# Patient Record
Sex: Male | Born: 2000 | Race: Black or African American | Hispanic: No | Marital: Single
Health system: Southern US, Community
[De-identification: ages and names within clinical notes are randomized; demographics above are authoritative.]

---
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
## 2020-10-08 ENCOUNTER — Other Ambulatory Visit: Payer: Self-pay | Admitting: Interventional Radiology

## 2020-10-08 DIAGNOSIS — I861 Scrotal varices: Secondary | ICD-10-CM

## 2020-10-20 ENCOUNTER — Encounter: Payer: Self-pay | Admitting: *Deleted

## 2020-10-20 ENCOUNTER — Ambulatory Visit
Admission: RE | Admit: 2020-10-20 | Discharge: 2020-10-20 | Disposition: A | Discharge status: Home / Self Care | Payer: BC Managed Care – PPO | Source: Ambulatory Visit | Attending: Interventional Radiology | Admitting: Interventional Radiology

## 2020-10-20 DIAGNOSIS — I861 Scrotal varices: Secondary | ICD-10-CM

## 2020-10-20 HISTORY — PX: IR RADIOLOGIST EVAL & MGMT: IMG5224

## 2020-10-20 NOTE — Progress Notes (Signed)
Chief Complaint: Patient was seen in consultation today for follow up of left varicocele.  History of Present Illness: Nathan Fuller is a 20 y.o. male who was first seen in January, 2020 for a prominent left varicocele with associated symptoms of discomfort.  Prior ultrasounds had demonstrated slight left testicular atrophy relative to the right.  Due to serial ultrasound evidence of decreasing discrepancy in testicular size and also decreasing symptoms, decision was made to continue to follow the varicocele and testicular volume.  A follow-up ultrasound was performed on 10/05/2018 that demonstrated equal sized testicles with no significant demonstrable left testicular atrophy.  Nathan Fuller has now completed 2 years of college at [employer] and is a Psychology major with a minor in [place] Studies.  He had surgery in December 2021 on both hips for bilateral labral tears and after repair he is doing well and resuming athletic activity.  He has some occasional mild discomfort related to the left varicocele and sometimes some tenderness when performing self examination.  He does not have any significant discomfort with physical activity or exercise. He had a physical and wellness exam in May and was advised to have his varicocele rechecked.  Allergies: Patient has no known allergies.  Medications: Prior to Admission medications   Medication Sig Start Date End Date Taking? Authorizing Provider  loratadine-pseudoephedrine (CLARITIN-D 24-HOUR) 10-240 MG 24 hr tablet Take 1 tablet by mouth daily as needed for allergies.    [provider]      Social History   Socioeconomic History   Marital status: Single    Spouse name: Not on file   Number of children: Not on file   Years of education: Not on file   Highest education level: Not on file  Occupational History   Not on file  Tobacco Use   Smoking status: Not on file   Smokeless tobacco: Not on file  Substance and Sexual  Activity   Alcohol use: Not on file   Drug use: Not on file   Sexual activity: Not on file  Other Topics Concern   Not on file  Social History Narrative   Not on file   Social Determinants of Health   Financial Resource Strain: Not on file  Food Insecurity: Not on file  Transportation Needs: Not on file  Physical Activity: Not on file  Stress: Not on file  Social Connections: Not on file    Review of Systems: A 12 point ROS discussed and pertinent positives are indicated in the HPI above.  All other systems are negative.  Review of Systems  Constitutional: Negative.   Respiratory: Negative.    Cardiovascular: Negative.   Genitourinary:        See above. Occasional mild left scrotal discomfort at level of varicocele.  Musculoskeletal: Negative.   Neurological: Negative.    Vital Signs: There were no vitals taken for this visit.  Physical Exam Genitourinary:    Penis: Normal.      Testes: Normal.     Comments: Large, palpable 4+ left varicocele with distention with Valsalva maneuver. Testicles are equal in volume with no palpable masses.    Imaging: US SCROTUM  Result Date: 10/20/2020 CLINICAL DATA:  Known prominent left varicocele. Evaluation for testicular size and volume. EXAM: ULTRASOUND OF SCROTUM TECHNIQUE: Complete ultrasound examination of the testicles, epididymis, and other scrotal structures was performed. COMPARISON:  10/05/2018 FINDINGS: Right testicle Measurements: 4.2 x 1.8 x 2.6 cm. No mass or microlithiasis visualized. Estimated volume of  10.2 mL. Left testicle Measurements: 4.2 x 2.1 x 2.5 cm. No mass or microlithiasis visualized. Estimated volume of 11.5 mL. Right epididymis:  Normal in size and appearance. Left epididymis:  Normal in size and appearance. Hydrocele:  None visualized. Varicocele:  Prominent left varicocele again identified. IMPRESSION: Increase in bilateral testicular volume since the prior ultrasound with slightly larger left testicle  compared to the right and no evidence of left testicular atrophy secondary to a known prominent left varicocele. No evidence of testicular mass. Electronically Signed   By: Irish Lack M.D.   On: 10/20/2020 11:36     Assessment and Plan:  A follow-up scrotal ultrasound was performed today.  Since the prior ultrasound 2 years ago, there is evidence of further testicular growth and increase in volumes with estimated right testicular volume of 10.2 mL and left testicular volume of 11.5 mL.  The left testicle is slightly larger and there is no evidence of left testicular atrophy secondary to the known varicocele.  Prominent left varicocele is evident by ultrasound as well as physical examination.  Due to his minimal symptoms of occasional discomfort and scrotal ultrasound demonstrating no evidence of left testicular atrophy, I did not recommend that we pursue any treatment of his varicocele at this time.  I told Nathan Fuller to contact me should he develop more significant discomfort on a regular basis related to the varicocele, especially with physical activity or prolonged standing.  I also told him to contact us should he develop noticeable size discrepancy of his testicles with decrease in size of the left relative to the right on self-examination.   Electronically Signed: Reola Calkins 10/20/2020, 11:46 AM    I spent a total of  10 Minutes in face to face in clinical consultation, greater than 50% of which was counseling/coordinating care for left varicocele.

## 2022-11-14 IMAGING — US US SCROTUM
1 series · 14 of 25 positions shown · non-contrast
Comparison: 10/05/2018

CLINICAL DATA: Known prominent left varicocele. Evaluation for
testicular size and volume.

EXAM:
ULTRASOUND OF SCROTUM
TECHNIQUE: Complete ultrasound examination of the testicles, epididymis, and
other scrotal structures was performed.

[Series 1: us scrotum · 0.05mm/px · 14 of 32 slices shown]
[im 1/32]
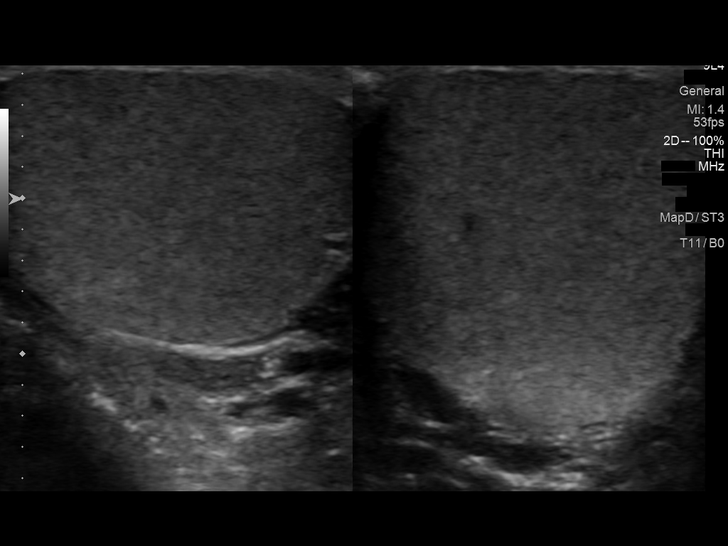
[im 3/32]
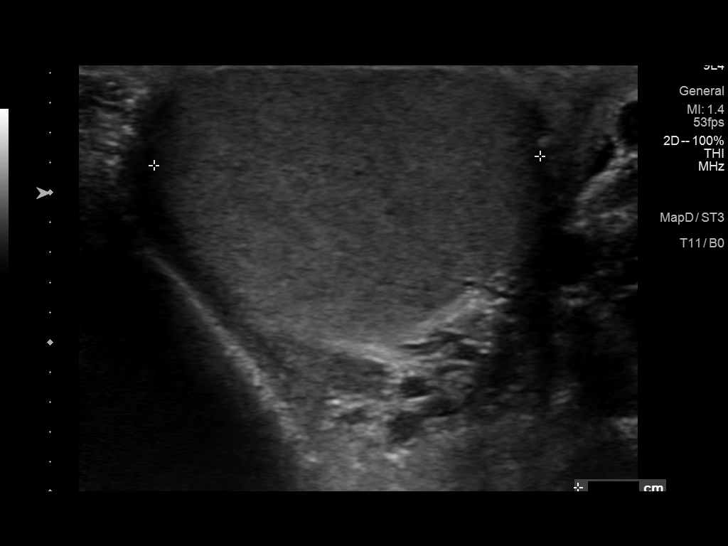
[im 6/32]
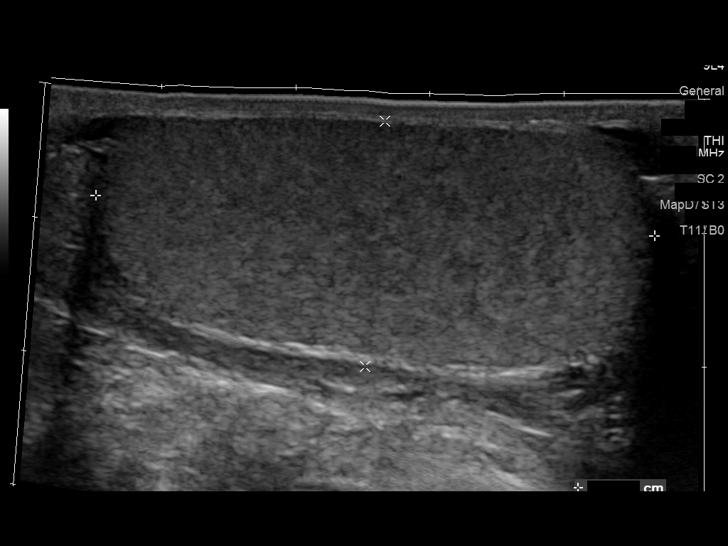
[im 8/32]
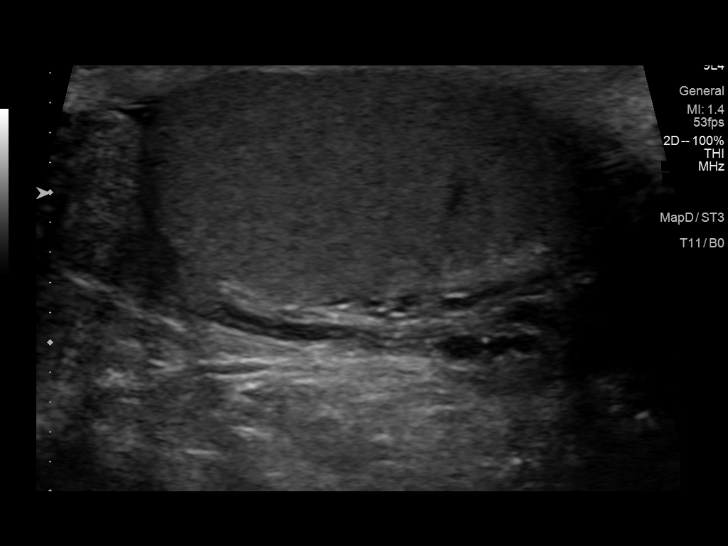
[im 11/32]
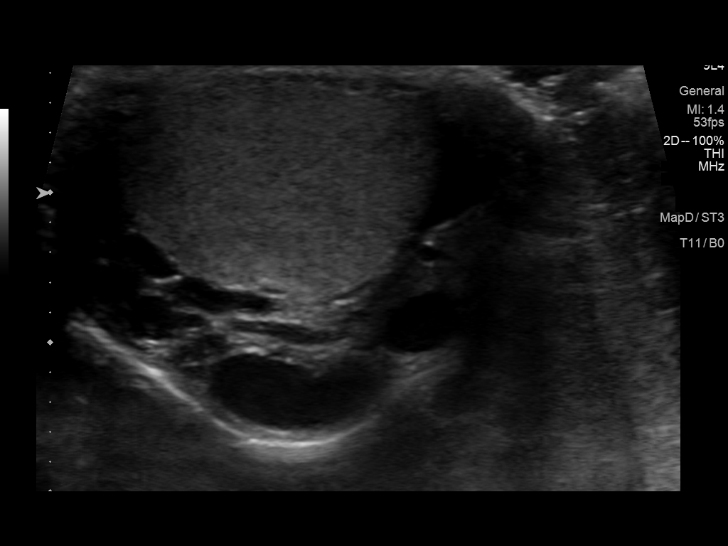
[im 12/32]
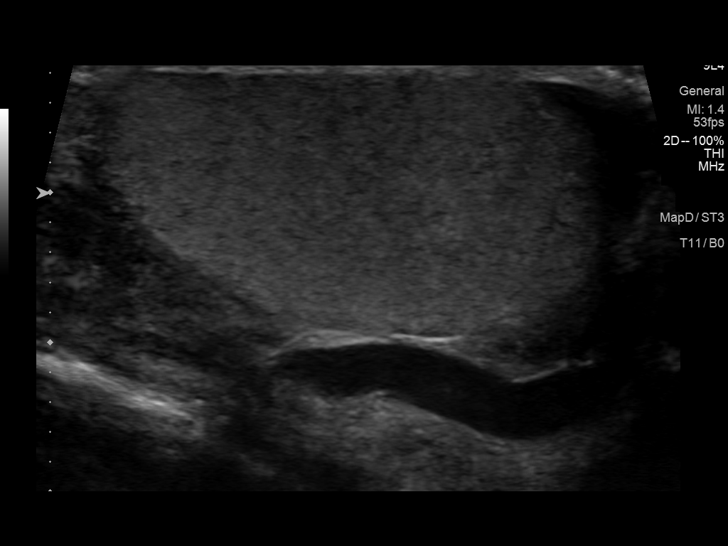
[im 15/32]
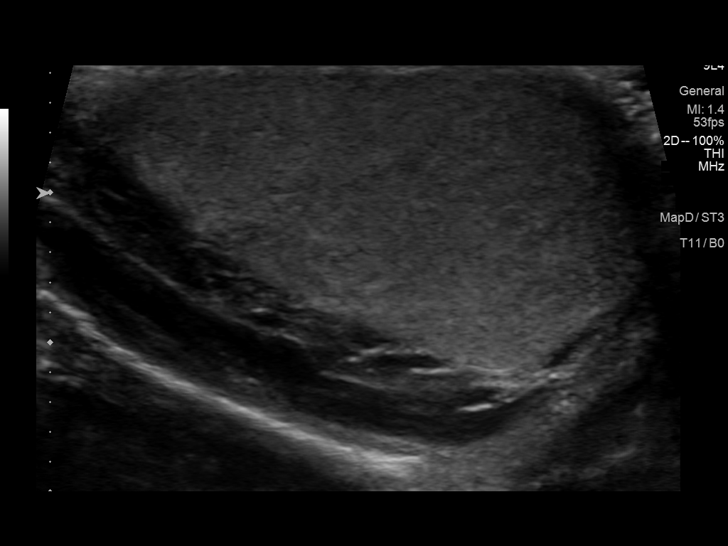
[im 17/32]
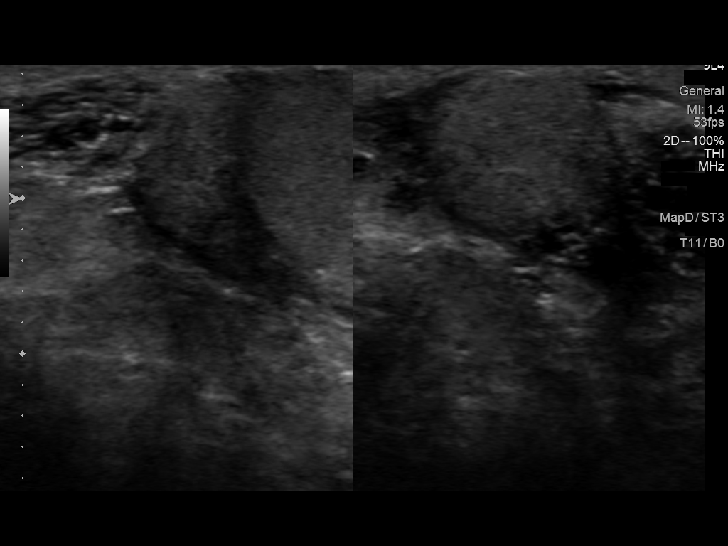
[im 20/32]
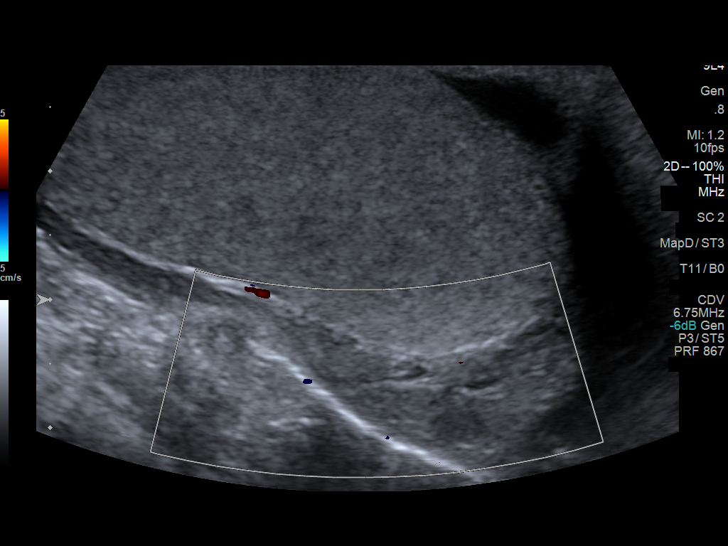
[im 21/32]
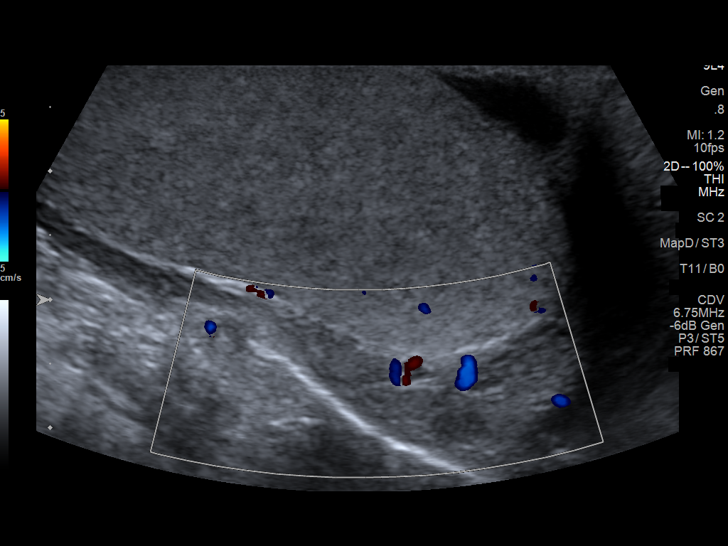
[im 24/32]
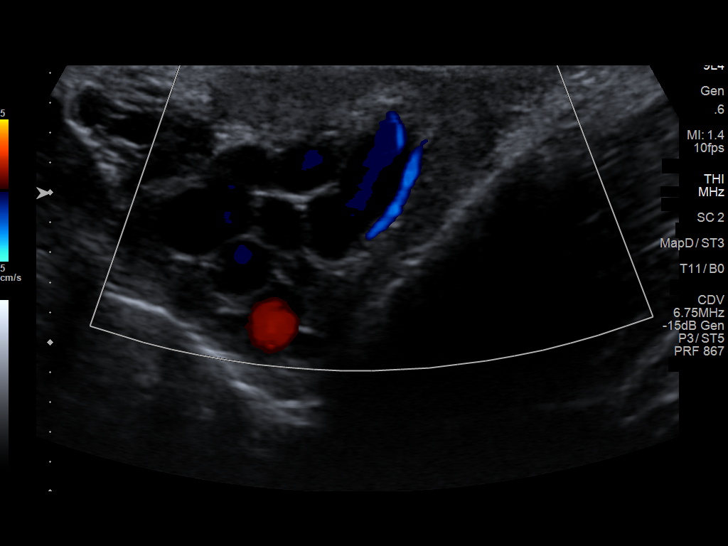
[im 26/32]
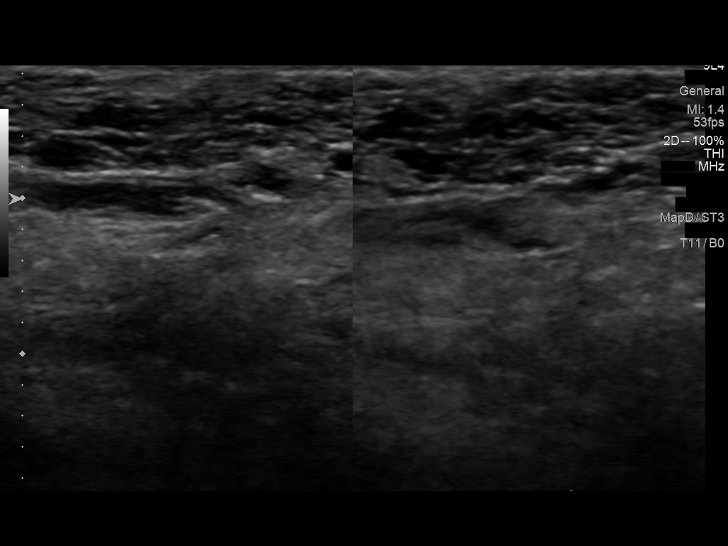
[im 29/32]
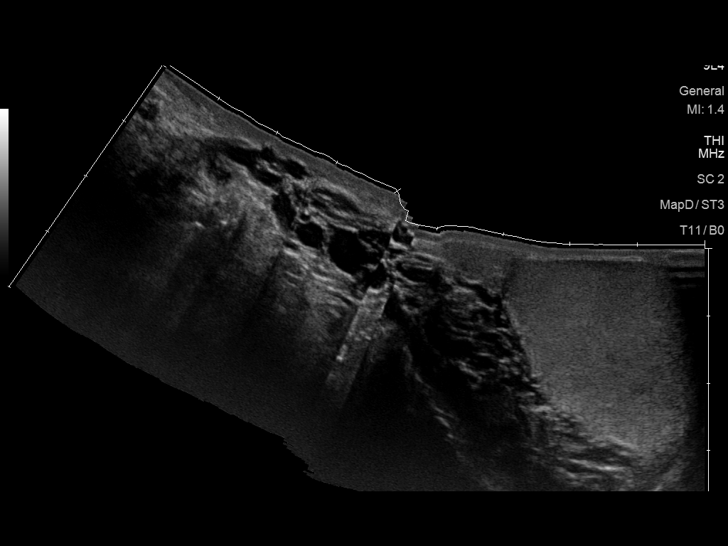
[im 32/32]
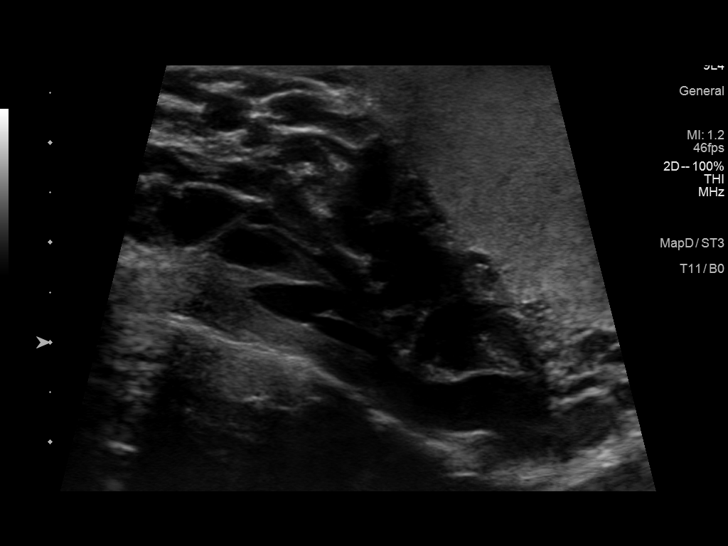

[14 of 25 positions shown; findings below may reference images not displayed]

FINDINGS: Right testicle

Measurements: 4.2 x 1.8 x 2.6 cm. No mass or microlithiasis
visualized. Estimated volume of 10.2 mL.

Left testicle

Measurements: 4.2 x 2.1 x 2.5 cm. No mass or microlithiasis
visualized. Estimated volume of 11.5 mL.

Right epididymis:  Normal in size and appearance.

Left epididymis:  Normal in size and appearance.

Hydrocele:  None visualized.

Varicocele:  Prominent left varicocele again identified.
IMPRESSION: Increase in bilateral testicular volume since the prior ultrasound
with slightly larger left testicle compared to the right and no
evidence of left testicular atrophy secondary to a known prominent
left varicocele. No evidence of testicular mass.

## 2023-05-15 ENCOUNTER — Other Ambulatory Visit: Payer: Self-pay | Admitting: Interventional Radiology

## 2023-05-15 ENCOUNTER — Other Ambulatory Visit: Payer: Self-pay | Admitting: Pediatrics

## 2023-05-15 DIAGNOSIS — I861 Scrotal varices: Secondary | ICD-10-CM

## 2023-05-29 ENCOUNTER — Ambulatory Visit
Admission: RE | Admit: 2023-05-29 | Discharge: 2023-05-29 | Disposition: A | Discharge status: Home / Self Care | Payer: BC Managed Care – PPO | Source: Ambulatory Visit | Attending: Pediatrics | Admitting: Pediatrics

## 2023-05-29 ENCOUNTER — Ambulatory Visit
Admission: RE | Admit: 2023-05-29 | Discharge: 2023-05-29 | Disposition: A | Discharge status: Home / Self Care | Payer: BC Managed Care – PPO | Source: Ambulatory Visit | Attending: Interventional Radiology | Admitting: Interventional Radiology

## 2023-05-29 DIAGNOSIS — I861 Scrotal varices: Secondary | ICD-10-CM

## 2023-05-29 HISTORY — PX: IR RADIOLOGIST EVAL & MGMT: IMG5224

## 2023-05-29 NOTE — Progress Notes (Signed)
 Chief Complaint: Patient was seen in consultation today for follow up of left varicocele.   History of Present Illness: Nathan Fuller is a 23 y.o. male who was first seen in January, 2020 for a prominent left varicocele with associated symptoms of discomfort.  Prior ultrasounds had demonstrated slight left testicular atrophy relative to the right.  Due to serial ultrasound evidence of decreasing discrepancy in testicular size and also decreasing symptoms, decision was made to continue to follow the varicocele and testicular volume. Prior ultrasound in 2020 and 2022 did not show evidence of left testicular atrophy.   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 He asked to be seen today due to increasing symptoms related to his left varicocele over the last year. He has noticed increasing "dull ache" and discomfort in his left scrotum especially with any strenuous activity or exercise. He also notices discomfort at other times during the day especially with prolonged standing and has symptoms of discomfort roughly every other day. His discomfort has gotten to the point where it is quite noticeable and affecting his everyday activities. He denies other abdominal or pelvic pain. He has had no other genital symptoms.   Allergies: Patient has no known allergies.  Medications: Prior to Admission medications   Medication Sig Start Date End Date Taking? Authorizing Provider  loratadine-pseudoephedrine (CLARITIN-D 24-HOUR) 10-240 MG 24 hr tablet Take 1 tablet by mouth daily as needed for allergies.    [provider]     No family history on file.  Social History   Socioeconomic History   Marital status: Single    Spouse name: Not on file   Number of children: Not on file   Years of education: Not on file   Highest education level: Not on file  Occupational History   Not on file  Tobacco Use   Smoking status: Not on file   Smokeless  tobacco: Not on file  Substance and Sexual Activity   Alcohol use: Not on file   Drug use: Not on file   Sexual activity: Not on file  Other Topics Concern   Not on file  Social History Narrative   Not on file   Social Drivers of Health   Financial Resource Strain: Low Risk  (07/07/2021)   Received from [facility]   Overall Financial Resource Strain (CARDIA)    Difficulty of Paying Living Expenses: Not hard at all  Food Insecurity: No Food Insecurity (07/07/2021)   Received from [facility]   Hunger Vital Sign    Worried About Running Out of Food in the Last Year: Never true    Ran Out of Food in the Last Year: Never true  Transportation Needs: No Transportation Needs (07/07/2021)   Received from [facility] - Transportation    In the past 12 months, has lack of transportation kept you from medical appointments or from getting medications?: No    Lack of Transportation (Non-Medical): No  Physical Activity: Sufficiently Active (04/01/2021)   Received from [facility]   Exercise Vital Sign    Days of Exercise per Week: 5 days    Minutes of Exercise per Session: 150+ min  Stress: No Stress Concern Present (04/01/2021)   Received from [facility] of Occupational Health - Occupational Stress Questionnaire    Feeling of Stress : Only a little  Social Connections:  Moderately Integrated (04/01/2021)   Received from [facility]   Social Connection and Isolation Panel [NHANES]    Frequency of Communication with Friends and Family: More than three times a week    Frequency of Social Gatherings with Friends and Family: More than three times a week    Attends Religious Services: More than 4 times per year    Active Member of [employer] or Organizations: Yes    Attends Engineer, structural: More than 4 times per year    Marital Status: Never married      Review of Systems: A 12 point ROS discussed and pertinent positives are indicated in the HPI above.  All other systems are negative.  Review of Systems  Constitutional: Negative.   Respiratory: Negative.    Cardiovascular: Negative.   Gastrointestinal: Negative.   Genitourinary:  Positive for testicular pain. Negative for dysuria, flank pain, frequency, genital sores, hematuria, penile discharge, penile pain and penile swelling.  Musculoskeletal: Negative.   Skin: Negative.   Neurological: Negative.     Vital Signs: There were no vitals taken for this visit.    Physical Exam Genitourinary:    Penis: Normal.      Comments: Very prominent 4+ left varicocele with distention with Valsalva maneuver. No palpable testicular masses or evidence of testicular atrophy.    Imaging: US  SCROTUM W/DOPPLER Result Date: 05/29/2023 CLINICAL DATA:  History of 4+ left varicocele. Evaluation for testicular size and volume. EXAM: SCROTAL ULTRASOUND DOPPLER ULTRASOUND OF THE TESTICLES TECHNIQUE: Complete ultrasound examination of the testicles, epididymis, and other scrotal structures was performed. Color and spectral Doppler ultrasound were also utilized to evaluate blood flow to the testicles. COMPARISON:  10/20/2020 FINDINGS: Right testicle Measurements: 4.2 x 1.7 x 2.8 cm. Estimated volume 10.4 mL. No mass or microlithiasis visualized. Left testicle Measurements: 3.8 x 1.8 x 2.9 cm. Estimated volume of 10.3 mL. No mass or microlithiasis visualized. Right epididymis:  Normal in size and appearance. Left epididymis:  Normal in size and appearance. Hydrocele:  None visualized. Varicocele: Prominent left varicocele again identified which increases in size with Valsalva maneuver. Pulsed Doppler interrogation of both testes demonstrates normal low resistance arterial and venous waveforms bilaterally. IMPRESSION: 1. Normal testicular size with equal volume of both testes. 2. Prominent left varicocele again  identified. Electronically Signed   By: Erica Hau M.D.   On: 05/29/2023 09:01     Assessment and Plan:  Nathan Fuller has had progressive symptoms of pain related to his prominent left varicocele over the past year to the point of nearly daily pain, particularly with exertion. There is no evidence of significant left testicular atrophy by ultrasound, although the left testicular volume is slightly smaller compared to 2022 but still equal to the right.   Based on his progressive symptoms, we again discussed the possibility of elective venography and embolization of the left testicular vein to decompress the varicocele for symptom control and to decrease the possibility of future infertility. Technical details, outcomes and recovery related to the procedure were discussed. After discussion, Nathan Fuller is interested in scheduling the procedure. We will schedule the procedure at [facility] at a convenient time for his work schedule.    Electronically Signed: Aileen Alexanders 05/29/2023, 8:08 AM    I spent a total of 15 Minutes in face to face in clinical consultation, greater than 50% of which was counseling/coordinating care for symptomatic left varicocele.

## 2023-07-06 ENCOUNTER — Telehealth (HOSPITAL_COMMUNITY): Payer: Self-pay | Admitting: Radiology

## 2023-07-06 NOTE — Telephone Encounter (Signed)
 Called pt to schedule his left testicular venogram and embolization with Dr. Fredia Sorrow. Left VM for him to call back to set this up. JM

## 2023-07-13 ENCOUNTER — Other Ambulatory Visit (HOSPITAL_COMMUNITY): Payer: Self-pay | Admitting: Interventional Radiology

## 2023-07-13 DIAGNOSIS — N50812 Left testicular pain: Secondary | ICD-10-CM

## 2023-07-13 DIAGNOSIS — I861 Scrotal varices: Secondary | ICD-10-CM

## 2023-08-02 ENCOUNTER — Other Ambulatory Visit: Payer: Self-pay | Admitting: Student

## 2023-08-02 ENCOUNTER — Other Ambulatory Visit: Payer: Self-pay | Admitting: Radiology

## 2023-08-02 DIAGNOSIS — I861 Scrotal varices: Secondary | ICD-10-CM

## 2023-08-02 NOTE — H&P (Signed)
 Chief Complaint: Patient was seen in consultation today for testicular varicocele  Referring Physician(s): Dr. Ihor Gully  Supervising Physician: Irish Lack  Patient Status: Nathan Fuller - Out-pt  History of Present Illness: Nathan Fuller is a 23 y.o. 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  Patient initially presented at the referral of Dr. Vernie Ammons in 2020 at which time he met in consultation with Dr. Fredia Sorrow as he was transitioning from pediatric to adult Urologic care. After discussion and imaging, decision was made to follow with surveillance as the left testicle had remained stable in size with only slight atrophy compared to the right.  More recently, he has become more symptomatic with increased discomfort, exercise intolerance, occasional aching.  He has elected to proceed with varicocele embolization.   Nathan Fuller presents for procedure today in his usual state of health.  He is aware of the plans and goals of the procedure today and is agreeable to proceed.  He is accompanied by his father who is available for care and transportation post-procedure.  History reviewed. No pertinent past medical history.  Past Surgical History:  Procedure Laterality Date   IR RADIOLOGIST EVAL & MGMT  05/10/2018   IR RADIOLOGIST EVAL & MGMT  10/09/2018   IR RADIOLOGIST EVAL & MGMT  10/20/2020   IR RADIOLOGIST EVAL & MGMT  05/29/2023    Allergies: Patient has no known allergies.  Medications: Prior to Admission medications   Medication Sig Start Date End Date Taking? Authorizing Provider  loratadine-pseudoephedrine (CLARITIN-D 24-HOUR) 10-240 MG 24 hr tablet Take 1 tablet by mouth daily as needed for allergies.    [provider]     History reviewed. No pertinent family history.  Social History   Socioeconomic History   Marital status: Single    Spouse name: Not on file   Number of children: Not on file   Years of  education: Not on file   Highest education level: Not on file  Occupational History   Not on file  Tobacco Use   Smoking status: Not on file   Smokeless tobacco: Not on file  Substance and Sexual Activity   Alcohol use: Not on file   Drug use: Not on file   Sexual activity: Not on file  Other Topics Concern   Not on file  Social History Narrative   Not on file   Social Drivers of Health   Financial Resource Strain: Low Risk  (07/07/2021)   Received from [facility]   Overall Financial Resource Strain (CARDIA)    Difficulty of Paying Living Expenses: Not hard at all  Food Insecurity: No Food Insecurity (07/07/2021)   Received from [facility]   Hunger Vital Sign    Worried About Running Out of Food in the Last Year: Never true    Ran Out of Food in the Last Year: Never true  Transportation Needs: No Transportation Needs (07/07/2021)   Received from [facility] - Transportation    In the past 12 months, has lack of transportation kept you from medical appointments or from getting medications?: No    Lack of Transportation (Non-Medical): No  Physical Activity: Sufficiently Active (04/01/2021)   Received from [facility]   Exercise Vital Sign    Days of Exercise per Week: 5 days    Minutes of Exercise per Session: 150+ min  Stress: No Stress Concern Present (04/01/2021)   Received  from [facility]   [employer] of Occupational Health - Occupational Stress Questionnaire    Feeling of Stress : Only a little  Social Connections: Moderately Integrated (04/01/2021)   Received from [facility]   Social Connection and Isolation Panel [NHANES]    Frequency of Communication with Friends and Family: More than three times a week    Frequency of Social Gatherings with Friends and Family: More than three times a week    Attends Religious Services: More than 4 times per  year    Active Member of [employer] or Organizations: Yes    Attends Engineer, structural: More than 4 times per year    Marital Status: Never married     Review of Systems: A 12 point ROS discussed and pertinent positives are indicated in the HPI above.  All other systems are negative.  Review of Systems  Constitutional:  Negative for fatigue and fever.  Respiratory:  Negative for cough and shortness of breath.   Cardiovascular:  Negative for chest pain.  Gastrointestinal:  Negative for abdominal pain, nausea and vomiting.  Musculoskeletal:  Negative for back pain.  Psychiatric/Behavioral:  Negative for behavioral problems and confusion.     Vital Signs: BP (!) 126/93   Pulse 66   Temp 98.3 F (36.8 C) (Oral)   Resp 18   Ht 5\' 9"  (1.753 m)   Wt 142 lb (64.4 kg)   SpO2 100%   BMI 20.97 kg/m   Physical Exam Vitals and nursing note reviewed.  Constitutional:      General: He is not in acute distress.    Appearance: Normal appearance. He is not ill-appearing.  HENT:     Mouth/Throat:     Mouth: Mucous membranes are moist.     Pharynx: Oropharynx is clear.  Cardiovascular:     Rate and Rhythm: Normal rate and regular rhythm.  Pulmonary:     Effort: Pulmonary effort is normal. No respiratory distress.     Breath sounds: Normal breath sounds.  Abdominal:     General: Abdomen is flat. There is no distension.     Palpations: Abdomen is soft.  Skin:    General: Skin is warm and dry.  Neurological:     General: No focal deficit present.     Mental Status: He is alert and oriented to person, place, and time. Mental status is at baseline.  Psychiatric:        Mood and Affect: Mood normal.        Behavior: Behavior normal.        Thought Content: Thought content normal.        Judgment: Judgment normal.      MD Evaluation Airway: WNL Heart: WNL Abdomen: WNL Chest/ Lungs: WNL ASA  Classification: 3 Mallampati/Airway Score: Two   Imaging: No results  found.  Labs:  CBC: Recent Labs    08/03/23 0715  WBC 5.0  HGB 15.8  HCT 47.7  PLT 197    COAGS: No results for input(s): "INR", "APTT" in the last 8760 hours.  BMP: No results for input(s): "NA", "K", "CL", "CO2", "GLUCOSE", "BUN", "CALCIUM", "CREATININE", "GFRNONAA", "GFRAA" in the last 8760 hours.  Invalid input(s): "CMP"  LIVER FUNCTION TESTS: No results for input(s): "BILITOT", "AST", "ALT", "ALKPHOS", "PROT", "ALBUMIN" in the last 8760 hours.  TUMOR MARKERS: No results for input(s): "AFPTM", "CEA", "CA199", "CHROMGRNA" in the last 8760 hours.  Assessment and Plan: Patient with past medical history of left  testicular varicocele presents with complaint of increased discomfort, throbbing, and exercise intolerance.   Case reviewed by Dr. Nereida Banning who recently met with patient in consultation 05/29/23 and given progressive nature of his symptoms recommends embolization procedure.    Patient presents today in their usual state of health.  He has been NPO and is not currently on blood thinners.   The Risks and benefits of embolization were discussed with the patient including, but not limited to bleeding, infection, vascular injury, post operative pain, or contrast induced renal failure.  This procedure involves the use of X-rays and because of the nature of the planned procedure, it is possible that we will have prolonged use of X-ray fluoroscopy.  Potential radiation risks to you include (but are not limited to) the following: - A slightly elevated risk for cancer several years later in life. This risk is typically less than 0.5% percent. This risk is low in comparison to the normal incidence of human cancer, which is 33% for women and 50% for men according to the American Cancer Society. - Radiation induced injury can include skin redness, resembling a rash, tissue breakdown / ulcers and hair loss (which can be temporary or permanent).   The likelihood of either of  these occurring depends on the difficulty of the procedure and whether you are sensitive to radiation due to previous procedures, disease, or genetic conditions.   IF your procedure requires a prolonged use of radiation, you will be notified and given written instructions for further action.  It is your responsibility to monitor the irradiated area for the 2 weeks following the procedure and to notify your physician if you are concerned that you have suffered a radiation induced injury.    All of the patient's questions were answered, patient is agreeable to proceed. Consent signed and in chart.     Thank you for this interesting consult.  I greatly enjoyed meeting Nathan Fuller and look forward to participating in their care.  A copy of this report was sent to the requesting provider on this date.  Electronically Signed: Lorree Millar Sue-Ellen Lavance Beazer, PA 08/03/2023, 8:02 AM   I spent a total of    15 Minutes in face to face in clinical consultation, greater than 50% of which was counseling/coordinating care for left symptomatic varicocele.

## 2023-08-03 ENCOUNTER — Encounter (HOSPITAL_COMMUNITY): Payer: Self-pay

## 2023-08-03 ENCOUNTER — Other Ambulatory Visit (HOSPITAL_COMMUNITY): Payer: Self-pay | Admitting: Interventional Radiology

## 2023-08-03 ENCOUNTER — Other Ambulatory Visit: Payer: Self-pay

## 2023-08-03 ENCOUNTER — Ambulatory Visit (HOSPITAL_COMMUNITY)
Admission: RE | Admit: 2023-08-03 | Discharge: 2023-08-03 | Disposition: A | Discharge status: Home / Self Care | Payer: Self-pay | Source: Ambulatory Visit | Attending: Interventional Radiology | Admitting: Interventional Radiology

## 2023-08-03 DIAGNOSIS — N50812 Left testicular pain: Secondary | ICD-10-CM

## 2023-08-03 DIAGNOSIS — I861 Scrotal varices: Secondary | ICD-10-CM

## 2023-08-03 HISTORY — PX: IR US GUIDE VASC ACCESS RIGHT: IMG2390

## 2023-08-03 HISTORY — PX: IR ANGIOGRAM SELECTIVE EACH ADDITIONAL VESSEL: IMG667

## 2023-08-03 HISTORY — PX: IR EMBO VENOUS NOT HEMORR HEMANG  INC GUIDE ROADMAPPING: IMG5447

## 2023-08-03 HISTORY — PX: IR VENOGRAM RENAL UNI LEFT: IMG680

## 2023-08-03 LAB — PROTIME-INR
INR: 1 (ref 0.8–1.2)
Prothrombin Time: 13.7 s (ref 11.4–15.2)

## 2023-08-03 LAB — CBC
HCT: 47.7 % (ref 39.0–52.0)
Hemoglobin: 15.8 g/dL (ref 13.0–17.0)
MCH: 28.5 pg (ref 26.0–34.0)
MCHC: 33.1 g/dL (ref 30.0–36.0)
MCV: 86.1 fL (ref 80.0–100.0)
Platelets: 197 10*3/uL (ref 150–400)
RBC: 5.54 MIL/uL (ref 4.22–5.81)
RDW: 12.5 % (ref 11.5–15.5)
WBC: 5 10*3/uL (ref 4.0–10.5)
nRBC: 0 % (ref 0.0–0.2)

## 2023-08-03 LAB — BASIC METABOLIC PANEL WITH GFR
Anion gap: 9 (ref 5–15)
BUN: 21 mg/dL — ABNORMAL HIGH (ref 6–20)
CO2: 25 mmol/L (ref 22–32)
Calcium: 9.6 mg/dL (ref 8.9–10.3)
Chloride: 101 mmol/L (ref 98–111)
Creatinine, Ser: 1.11 mg/dL (ref 0.61–1.24)
GFR, Estimated: >60 mL/min (ref >60)
Glucose, Bld: 81 mg/dL (ref 70–99)
Potassium: 4 mmol/L (ref 3.5–5.1)
Sodium: 135 mmol/L (ref 135–145)

## 2023-08-03 MED ORDER — IBUPROFEN 600 MG PO TABS: 600.0000 mg | ORAL_TABLET | Freq: Three times a day (TID) | ORAL | 0 refills | Status: AC

## 2023-08-03 MED ORDER — IBUPROFEN 600 MG PO TABS
600.0000 mg | ORAL_TABLET | Freq: Once | ORAL | Status: AC
  Administered 2023-08-03: 600 mg via ORAL
  Filled 2023-08-03: qty 1

## 2023-08-03 MED ORDER — SODIUM CHLORIDE 0.9 % IV SOLN
INTRAVENOUS | Status: DC
Start: 2023-08-03 — End: 2023-08-04

## 2023-08-03 MED ORDER — CEFAZOLIN SODIUM-DEXTROSE 2-4 GM/100ML-% IV SOLN
2.0000 g | Freq: Once | INTRAVENOUS | Status: AC
  Administered 2023-08-03: 2 g via INTRAVENOUS

## 2023-08-03 MED ORDER — FENTANYL CITRATE (PF) 100 MCG/2ML IJ SOLN
INTRAMUSCULAR | Status: AC
  Filled 2023-08-03: qty 4

## 2023-08-03 MED ORDER — LIDOCAINE HCL 1 % IJ SOLN
INTRAMUSCULAR | Status: AC
  Filled 2023-08-03: qty 20

## 2023-08-03 MED ORDER — IOHEXOL 300 MG/ML  SOLN
150.0000 mL | Freq: Once | INTRAMUSCULAR | Status: AC | PRN
  Administered 2023-08-03: 55 mL via INTRA_ARTERIAL

## 2023-08-03 MED ORDER — MIDAZOLAM HCL 2 MG/2ML IJ SOLN
INTRAMUSCULAR | Status: AC | PRN
  Administered 2023-08-03: 1 mg via INTRAVENOUS
  Administered 2023-08-03: .5 mg via INTRAVENOUS
  Administered 2023-08-03: 1 mg via INTRAVENOUS
  Administered 2023-08-03: .5 mg via INTRAVENOUS

## 2023-08-03 MED ORDER — CEFAZOLIN SODIUM-DEXTROSE 2-4 GM/100ML-% IV SOLN
INTRAVENOUS | Status: AC
  Filled 2023-08-03: qty 100

## 2023-08-03 MED ORDER — FENTANYL CITRATE (PF) 100 MCG/2ML IJ SOLN
INTRAMUSCULAR | Status: AC | PRN
  Administered 2023-08-03 (×2): 25 ug via INTRAVENOUS
  Administered 2023-08-03: 50 ug via INTRAVENOUS

## 2023-08-03 MED ORDER — MIDAZOLAM HCL 2 MG/2ML IJ SOLN
INTRAMUSCULAR | Status: AC
  Filled 2023-08-03: qty 4

## 2023-08-03 MED ORDER — LIDOCAINE HCL 1 % IJ SOLN
20.0000 mL | Freq: Once | INTRAMUSCULAR | Status: AC
  Administered 2023-08-03: 8 mL via INTRADERMAL

## 2023-08-03 NOTE — Progress Notes (Signed)
Patient and father was given discharge instructions. Both verbalized understanding. ?

## 2023-08-03 NOTE — Discharge Instructions (Signed)
 Nathan Fuller

## 2023-08-03 NOTE — Procedures (Signed)
 Interventional Radiology Procedure Note  Procedure: Left renal and testicular venography. Embolization of left testicular vein and branches.   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 Embolization of testicular vein branches and main trunk with 0.018" coils, 0.035" coils and MVP 9Q plug.  Good occlusion achieved.  Nonda Bays. Nereida Banning, M.D Pager:  [phone]

## 2023-08-11 ENCOUNTER — Other Ambulatory Visit: Payer: Self-pay | Admitting: Interventional Radiology

## 2023-08-11 DIAGNOSIS — I861 Scrotal varices: Secondary | ICD-10-CM

## 2023-09-07 ENCOUNTER — Ambulatory Visit
Admission: RE | Admit: 2023-09-07 | Discharge: 2023-09-07 | Disposition: A | Discharge status: Home / Self Care | Source: Ambulatory Visit | Attending: Interventional Radiology | Admitting: Interventional Radiology

## 2023-09-07 DIAGNOSIS — I861 Scrotal varices: Secondary | ICD-10-CM

## 2023-09-07 HISTORY — PX: IR RADIOLOGIST EVAL & MGMT: IMG5224

## 2023-09-07 NOTE — Progress Notes (Signed)
 Chief Complaint: Patient was consulted remotely today (TeleHealth) for follow up following left varicocele embolization on 08/03/23.  History of Present Illness: Nathan Fuller, "Nathan Fuller", is a 23 y.o. male status post venography of the left testicular vein and embolization of the left testicular vein with embolization coils and a vascular plug on 08/03/23 to treat a large, symptomatic varicocele. He states he did well after the procedure with minimal soreness, primarily in his right neck at the site of jugular vein access. He states that he had some mild left scrotal discomfort last week but that has resolved. He has noticed definite decreased distention of scrotal veins on the left after embolization and significant decrease in his chronic discomfort related to the left varicocele. He denies any fever, urinary symptoms or penile pain after the procedure. He is back to normal activity and has been exercising, but has been somewhat hampered recently by a toe injury.  No past medical history on file.  Past Surgical History:  Procedure Laterality Date   IR ANGIOGRAM SELECTIVE EACH ADDITIONAL VESSEL  08/03/2023   IR ANGIOGRAM SELECTIVE EACH ADDITIONAL VESSEL  08/03/2023   IR ANGIOGRAM SELECTIVE EACH ADDITIONAL VESSEL  08/03/2023   IR ANGIOGRAM SELECTIVE EACH ADDITIONAL VESSEL  08/03/2023   IR EMBO VENOUS NOT HEMORR HEMANG  INC GUIDE ROADMAPPING  08/03/2023   IR RADIOLOGIST EVAL & MGMT  05/10/2018   IR RADIOLOGIST EVAL & MGMT  10/09/2018   IR RADIOLOGIST EVAL & MGMT  10/20/2020   IR RADIOLOGIST EVAL & MGMT  05/29/2023   IR US  GUIDE VASC ACCESS RIGHT  08/03/2023   IR VENOGRAM RENAL UNI LEFT  08/03/2023    Allergies: Patient has no known allergies.  Medications: Prior to Admission medications   Medication Sig Start Date End Date Taking? Authorizing Provider  loratadine-pseudoephedrine (CLARITIN-D 24-HOUR) 10-240 MG 24 hr tablet Take 1 tablet by mouth daily as needed for allergies.    [provider]     No family history on file.  Social History   Socioeconomic History   Marital status: Single    Spouse name: Not on file   Number of children: Not on file   Years of education: Not on file   Highest education level: Not on file  Occupational History   Not on file  Tobacco Use   Smoking status: Not on file   Smokeless tobacco: Not on file  Substance and Sexual Activity   Alcohol use: Not on file   Drug use: Not on file   Sexual activity: Not on file  Other Topics Concern   Not on file  Social History Narrative   Not on file   Social Drivers of Health   Financial Resource Strain: Low Risk  (07/07/2021)   Received from [facility]   Overall Financial Resource Strain (CARDIA)    Difficulty of Paying Living Expenses: Not hard at all  Food Insecurity: No Food Insecurity (07/07/2021)   Received from [facility]   Hunger Vital Sign    Worried About Running Out of Food in the Last Year: Never true    Ran Out of Food in the Last Year: Never true  Transportation Needs: No Transportation Needs (07/07/2021)   Received from [facility] - Transportation    In the past 12 months, has lack of transportation kept you from medical appointments or from getting medications?: No    Lack of Transportation (Non-Medical): No  Physical Activity:  Sufficiently Active (04/01/2021)   Received from [facility]   Exercise Vital Sign    Days of Exercise per Week: 5 days    Minutes of Exercise per Session: 150+ min  Stress: No Stress Concern Present (04/01/2021)   Received from [facility] of Occupational Health - Occupational Stress Questionnaire    Feeling of Stress : Only a little  Social Connections: Moderately Integrated (04/01/2021)   Received from [facility]   Social Connection and Isolation Panel [NHANES]    Frequency of Communication  with Friends and Family: More than three times a week    Frequency of Social Gatherings with Friends and Family: More than three times a week    Attends Religious Services: More than 4 times per year    Active Member of [employer] or Organizations: Yes    Attends Engineer, structural: More than 4 times per year    Marital Status: Never married     Review of Systems  Constitutional: Negative.   Respiratory: Negative.    Cardiovascular: Negative.   Gastrointestinal: Negative.   Genitourinary: Negative.   Musculoskeletal:        Recent toe injury.  Neurological: Negative.     Review of Systems: A 12 point ROS discussed and pertinent positives are indicated in the HPI above.  All other systems are negative.    Physical Exam No direct physical exam was performed (except for noted visual exam findings with Video Visits).    Vital Signs: There were no vitals taken for this visit.  Imaging: No results found.  Labs:  CBC: Recent Labs    08/03/23 0715  WBC 5.0  HGB 15.8  HCT 47.7  PLT 197    COAGS: Recent Labs    08/03/23 0715  INR 1.0    BMP: Recent Labs    08/03/23 0715  NA 135  K 4.0  CL 101  CO2 25  GLUCOSE 81  BUN 21*  CALCIUM 9.6  CREATININE 1.11  GFRNONAA >60     Assessment and Plan:  Nathan Fuller is doing well 5 weeks after embolization of the left testicular vein to treat a large, symptomatic varicocele. He has noticed symptomatic improvement and decreased prominence of testicular veins. I told him that he has no restrictions now and to contact me should he notice recurrence of any chronic discomfort at the level of the left testicle/scrotum or visible recurrent distention of left sided testicular veins.     Electronically Signed: Aileen Alexanders 09/07/2023, 1:08 PM    I spent a total of 10 Minutes in remote  clinical consultation, greater than 50% of which was counseling/coordinating care for status post left varicocele embolization.     Visit type: Audio and video Product/process development scientist).   Alternative for in-person consultation at [facility], [address], [place], [place]. This visit type was conducted due to national recommendations for restrictions regarding the COVID-19 Pandemic (e.g. social distancing).  This format is felt to be most appropriate for this patient at this time.  All issues noted in this document were discussed and addressed.

## 2024-03-19 ENCOUNTER — Other Ambulatory Visit: Payer: Self-pay | Admitting: Interventional Radiology

## 2024-03-19 DIAGNOSIS — I861 Scrotal varices: Secondary | ICD-10-CM

## 2024-04-05 ENCOUNTER — Inpatient Hospital Stay: Admission: RE | Admit: 2024-04-05 | Source: Ambulatory Visit
